# Patient Record
Sex: Male | Born: 1995 | Race: Black or African American | Hispanic: No | Marital: Single | Smoking: Current every day smoker
Health system: Southern US, Community
[De-identification: ages and names within clinical notes are randomized; demographics above are authoritative.]

---

## 2017-12-03 ENCOUNTER — Emergency Department (HOSPITAL_COMMUNITY)
Admission: EM | Admit: 2017-12-03 | Discharge: 2017-12-03 | Disposition: A | Discharge status: Home / Self Care | Payer: Self-pay | Attending: Emergency Medicine | Admitting: Emergency Medicine

## 2017-12-03 ENCOUNTER — Encounter (HOSPITAL_COMMUNITY): Payer: Self-pay | Admitting: *Deleted

## 2017-12-03 ENCOUNTER — Emergency Department (HOSPITAL_COMMUNITY): Payer: Self-pay

## 2017-12-03 ENCOUNTER — Other Ambulatory Visit: Payer: Self-pay

## 2017-12-03 DIAGNOSIS — F17200 Nicotine dependence, unspecified, uncomplicated: Secondary | ICD-10-CM | POA: Insufficient documentation

## 2017-12-03 DIAGNOSIS — J4541 Moderate persistent asthma with (acute) exacerbation: Secondary | ICD-10-CM | POA: Insufficient documentation

## 2017-12-03 DIAGNOSIS — J302 Other seasonal allergic rhinitis: Secondary | ICD-10-CM | POA: Insufficient documentation

## 2017-12-03 MED ORDER — PREDNISONE 10 MG PO TABS: ORAL_TABLET | ORAL | 0 refills | Status: AC

## 2017-12-03 MED ORDER — ALBUTEROL SULFATE HFA 108 (90 BASE) MCG/ACT IN AERS
2.0000 | INHALATION_SPRAY | RESPIRATORY_TRACT | Status: DC
  Administered 2017-12-03: 2 via RESPIRATORY_TRACT
  Filled 2017-12-03: qty 6.7

## 2017-12-03 MED ORDER — ALBUTEROL SULFATE (2.5 MG/3ML) 0.083% IN NEBU
5.0000 mg | INHALATION_SOLUTION | Freq: Once | RESPIRATORY_TRACT | Status: AC
  Administered 2017-12-03: 5 mg via RESPIRATORY_TRACT
  Filled 2017-12-03: qty 6

## 2017-12-03 MED ORDER — PREDNISONE 20 MG PO TABS
60.0000 mg | ORAL_TABLET | Freq: Once | ORAL | Status: AC
  Administered 2017-12-03: 60 mg via ORAL
  Filled 2017-12-03: qty 3

## 2017-12-03 NOTE — ED Provider Notes (Signed)
[facility]   CSN: [id number] Arrival date & time: 12/03/17  0607     History   Chief Complaint Chief Complaint  Patient presents with  . Shortness of Breath    HPI Herbert Jordan is a 22 y.o. male.  The history is provided by the patient. No language interpreter was used.  Shortness of Breath  This is a new problem. The problem occurs intermittently.The current episode started more than 2 days ago. The problem has been gradually worsening. Pertinent negatives include no fever. The problem's precipitants include pollens. He has tried nothing for the symptoms. The treatment provided no relief. Associated medical issues include asthma.    History reviewed. No pertinent past medical history.  There are no active problems to display for this patient.   History reviewed. No pertinent surgical history.      Home Medications    Prior to Admission medications   Medication Sig Start Date End Date Taking? Authorizing Provider  predniSONE (DELTASONE) 10 MG tablet 5,4,3,2,1 taper 12/03/17   Elson AreasSofia, Sholonda Jobst K, PA-C    Family History No family history on file.  Social History Social History   Tobacco Use  . Smoking status: Current Every Day Smoker  . Smokeless tobacco: Never Used  Substance Use Topics  . Alcohol use: Never    Frequency: Never  . Drug use: Not on file     Allergies   Patient has no known allergies.   Review of Systems Review of Systems  Constitutional: Negative for fever.  Respiratory: Positive for shortness of breath.   All other systems reviewed and are negative.    Physical Exam Updated Vital Signs BP 136/74 (BP Location: Right Arm)   Pulse 94   Temp 98.5 F (36.9 C) (Oral)   Resp 20   Ht 5\' 10"  (1.778 m)   Wt 96.6 kg (213 lb)   SpO2 100%   BMI 30.56 kg/m   Physical Exam  Constitutional: He appears well-developed and well-nourished.  HENT:  Head: Normocephalic and atraumatic.    Eyes: Conjunctivae are normal.  Neck: Neck supple.  Cardiovascular: Normal rate and regular rhythm.  No murmur heard. Pulmonary/Chest: Effort normal. No respiratory distress. He has wheezes in the right upper field, the right middle field, the right lower field, the left upper field, the left middle field and the left lower field.  Abdominal: Soft. There is no tenderness.  Musculoskeletal: He exhibits no edema.  Neurological: He is alert.  Skin: Skin is warm and dry.  Psychiatric: He has a normal mood and affect.  Nursing note and vitals reviewed.    ED Treatments / Results  Labs (all labs ordered are listed, but only abnormal results are displayed) Labs Reviewed - No data to display  EKG EKG Interpretation  Date/Time:  Thursday December 03 2017 06:37:03 EDT Ventricular Rate:  73 PR Interval:  146 QRS Duration: 100 QT Interval:  396 QTC Calculation: 436 R Axis:   75 Text Interpretation:  Normal sinus rhythm with sinus arrhythmia Normal ECG No old tracing to compare Confirmed by Shaune PollackIsaacs, Cameron [phone]([id number]) on 12/03/2017 7:27:12 AM   Radiology Dg Chest 2 View  Result Date: 12/03/2017 CLINICAL DATA:  Acute onset of generalized chest pain, shortness of breath and cough. EXAM: CHEST - 2 VIEW COMPARISON:  None. FINDINGS: The lungs are well-aerated and clear. There is no evidence of focal opacification, pleural effusion or pneumothorax. The heart is normal in size; the mediastinal contour is within  normal limits. No acute osseous abnormalities are seen. IMPRESSION: No acute cardiopulmonary process seen. Electronically Signed   By: Roanna Raider M.D.   On: 12/03/2017 07:19    Procedures Procedures (including critical care time)  Medications Ordered in ED Medications  albuterol (PROVENTIL HFA;VENTOLIN HFA) 108 (90 Base) MCG/ACT inhaler 2 puff (2 puffs Inhalation Given 12/03/17 0903)  albuterol (PROVENTIL) (2.5 MG/3ML) 0.083% nebulizer solution 5 mg (5 mg Nebulization Given 12/03/17 0838)   predniSONE (DELTASONE) tablet 60 mg (60 mg Oral Given 12/03/17 0903)     Initial Impression / Assessment and Plan / ED Course  I have reviewed the triage vital signs and the nursing notes.  Pertinent labs & imaging results that were available during my care of the patient were reviewed by me and considered in my medical decision making (see chart for details).    MDM  Pt feels better after albuterol,  Pt given prednisone and albuterol inhaler.   An After Visit Summary was printed and given to the patient.  Final Clinical Impressions(s) / ED Diagnoses   Final diagnoses:  Moderate persistent asthma with exacerbation  Seasonal allergies    ED Discharge Orders        Ordered    predniSONE (DELTASONE) 10 MG tablet     12/03/17 0851     Meds ordered this encounter  Medications  . albuterol (PROVENTIL) (2.5 MG/3ML) 0.083% nebulizer solution 5 mg  . predniSONE (DELTASONE) tablet 60 mg  . albuterol (PROVENTIL HFA;VENTOLIN HFA) 108 (90 Base) MCG/ACT inhaler 2 puff  . predniSONE (DELTASONE) 10 MG tablet    Sig: 5,4,3,2,1 taper    Dispense:  15 tablet    Refill:  0    Order Specific Question:   Supervising Provider    Answer:   Eber Hong [[id number]]      Elson Areas, PA-C 12/03/17 4540    Shaune Pollack, MD 12/03/17 [phone]

## 2017-12-03 NOTE — Discharge Instructions (Signed)
Return if any problems.

## 2017-12-03 NOTE — ED Triage Notes (Signed)
The pt is c/o some difficulty breathing since yesterday  He has been taking benadryl and that has not helped  He stopped smoking one month ago  No respiratory distress at present  He has seasonal allergies

## 2019-05-02 IMAGING — CR DG CHEST 2V
2 series · 2 of 2 positions shown · non-contrast
Comparison: None.

CLINICAL DATA: Acute onset of generalized chest pain, shortness of
breath and cough.

EXAM:
CHEST - 2 VIEW

[chest pa]
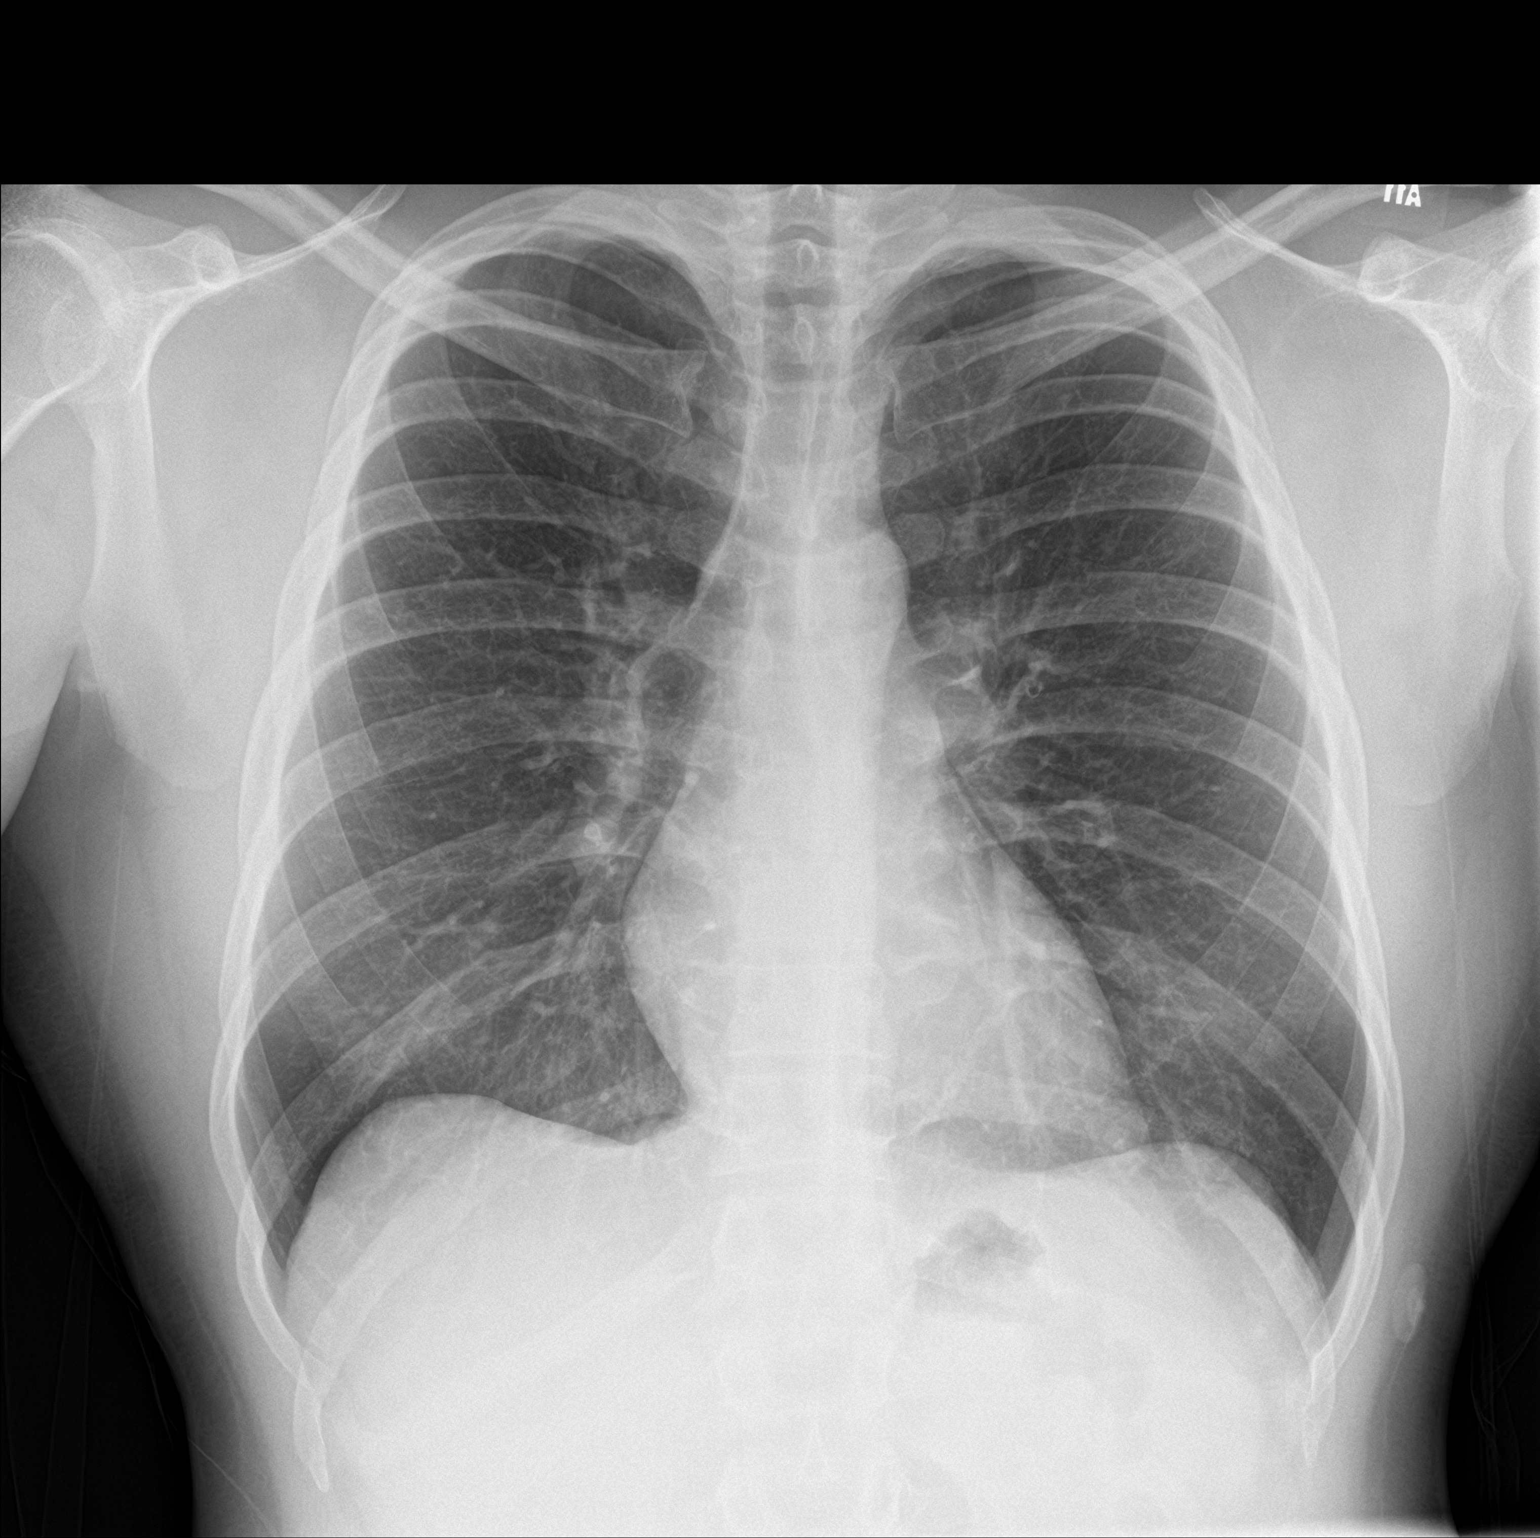

[chest lat]
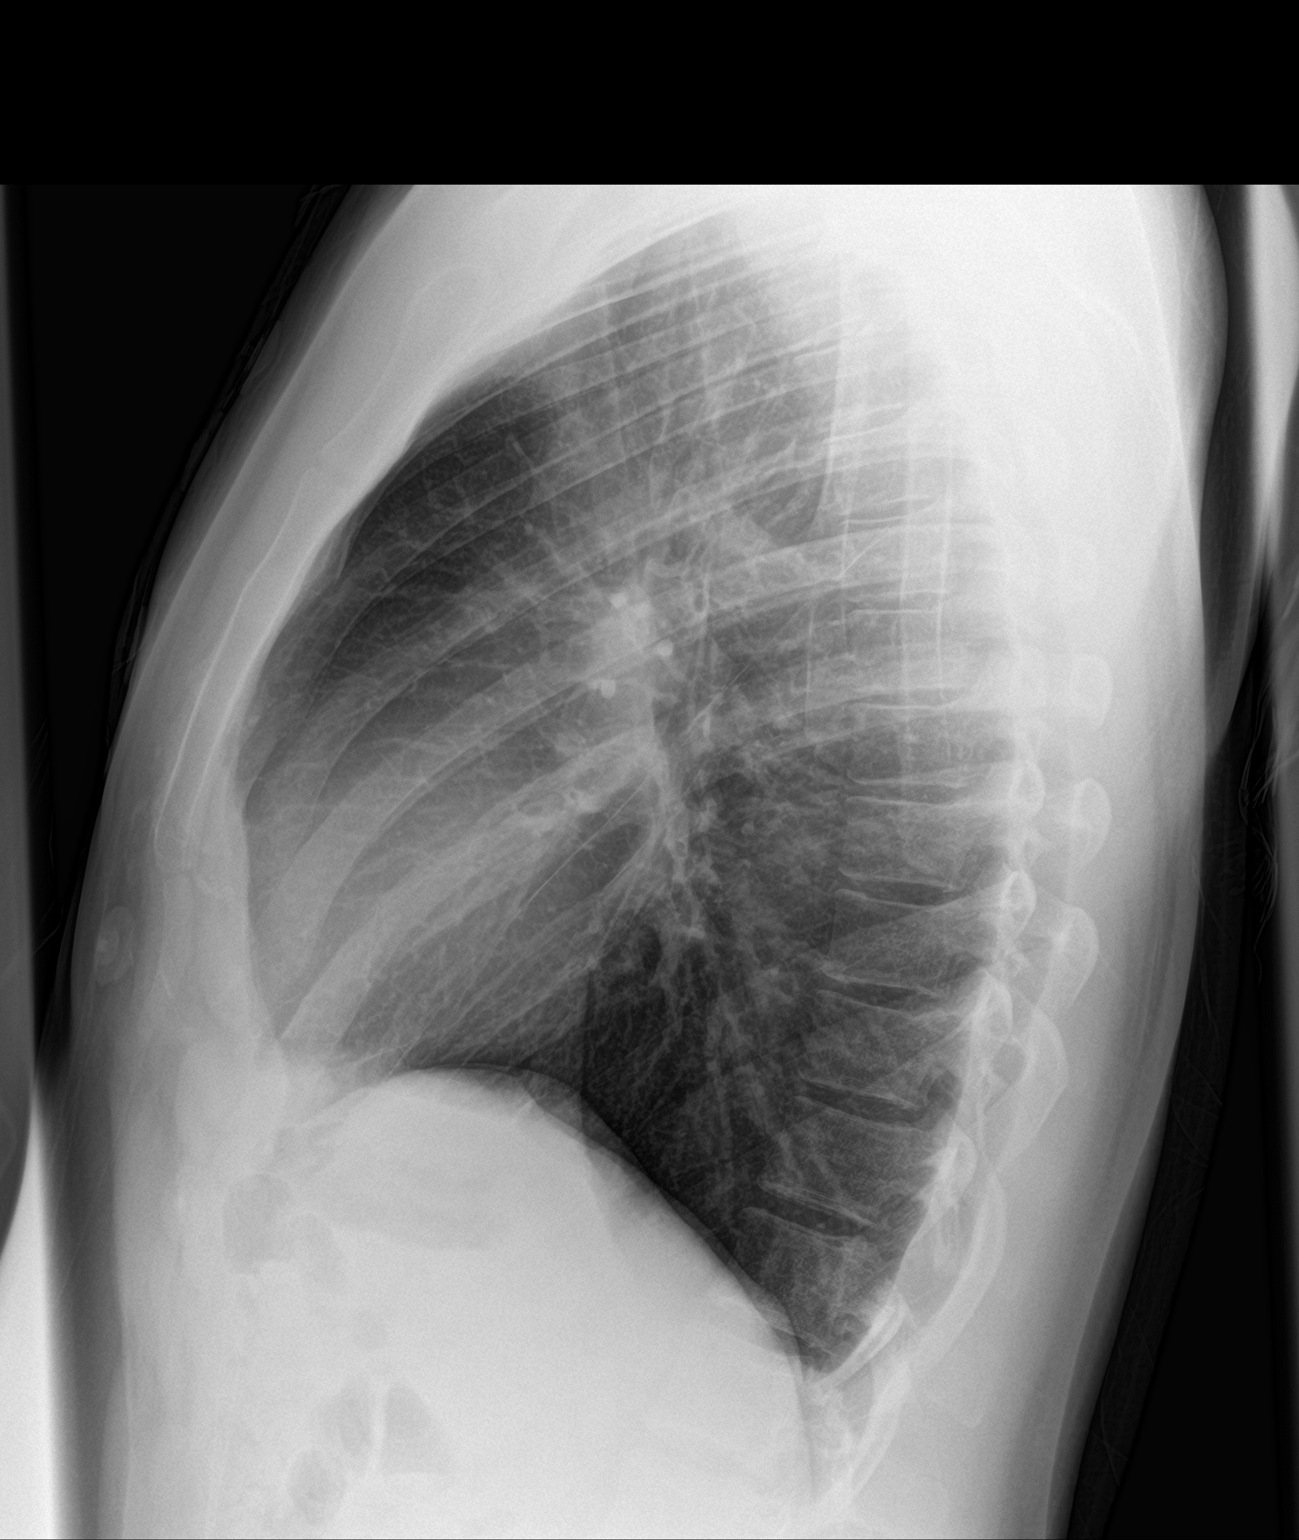

[2 of 2 positions shown; findings below may reference images not displayed]

FINDINGS: The lungs are well-aerated and clear. There is no evidence of focal
opacification, pleural effusion or pneumothorax.

The heart is normal in size; the mediastinal contour is within
normal limits. No acute osseous abnormalities are seen.
IMPRESSION: No acute cardiopulmonary process seen.

## 2019-10-21 ENCOUNTER — Ambulatory Visit (HOSPITAL_COMMUNITY)
Admission: EM | Admit: 2019-10-21 | Discharge: 2019-10-21 | Disposition: A | Discharge status: Home / Self Care | Payer: BC Managed Care – PPO | Attending: Family Medicine | Admitting: Family Medicine

## 2019-10-21 ENCOUNTER — Encounter (HOSPITAL_COMMUNITY): Payer: Self-pay

## 2019-10-21 ENCOUNTER — Other Ambulatory Visit: Payer: Self-pay

## 2019-10-21 DIAGNOSIS — Z113 Encounter for screening for infections with a predominantly sexual mode of transmission: Secondary | ICD-10-CM | POA: Insufficient documentation

## 2019-10-21 NOTE — ED Triage Notes (Signed)
Pt presents for STD testing with no known symptoms.  

## 2019-10-21 NOTE — Discharge Instructions (Addendum)
Your STD tests are pending.  If your test results are positive, we will call you.  You may need additional treatment and your partner(s) may also need treatment.      

## 2019-10-21 NOTE — ED Provider Notes (Signed)
[facility]    CSN: [id number] Arrival date & time: 10/21/19  1339      History   Chief Complaint Chief Complaint  Patient presents with  . STD Testing    HPI Herbert Jordan is a 24 y.o. male.   Reports that he would like to be swabbed for STDs today.  Denies any penile discharge, dysuria, pain with sex, any other symptoms.  Denies that any of his recent partners have told him that he needs to be tested.  ROS per HPI  The history is provided by the patient.    History reviewed. No pertinent past medical history.  There are no problems to display for this patient.   History reviewed. No pertinent surgical history.     Home Medications    Prior to Admission medications   Medication Sig Start Date End Date Taking? Authorizing Provider  predniSONE (DELTASONE) 10 MG tablet 5,4,3,2,1 taper 12/03/17   Elson Areas, PA-C    Family History Family History  Family history unknown: Yes    Social History Social History   Tobacco Use  . Smoking status: Current Every Day Smoker  . Smokeless tobacco: Never Used  Substance Use Topics  . Alcohol use: Never  . Drug use: Not on file     Allergies   Patient has no known allergies.   Review of Systems Review of Systems   Physical Exam Triage Vital Signs ED Triage Vitals  Enc Vitals Group     BP      Pulse      Resp      Temp      Temp src      SpO2      Weight      Height      Head Circumference      Peak Flow      Pain Score      Pain Loc      Pain Edu?      Excl. in GC?    No data found.  Updated Vital Signs BP 125/77 (BP Location: Left Arm)   Pulse 75   Temp 98.5 F (36.9 C) (Oral)   Resp 18   SpO2 100%   Visual Acuity Right Eye Distance:   Left Eye Distance:   Bilateral Distance:    Right Eye Near:   Left Eye Near:    Bilateral Near:     Physical Exam Vitals and nursing note reviewed.  Constitutional:      General: He is not in acute distress.    Appearance:  Normal appearance. He is well-developed and normal weight.  HENT:     Head: Normocephalic and atraumatic.  Eyes:     Conjunctiva/sclera: Conjunctivae normal.  Cardiovascular:     Rate and Rhythm: Normal rate and regular rhythm.     Heart sounds: Normal heart sounds. No murmur.  Pulmonary:     Effort: Pulmonary effort is normal. No respiratory distress.     Breath sounds: Normal breath sounds.  Abdominal:     Palpations: Abdomen is soft.     Tenderness: There is no abdominal tenderness.  Musculoskeletal:     Cervical back: Neck supple.  Skin:    General: Skin is warm and dry.  Neurological:     General: No focal deficit present.     Mental Status: He is alert and oriented to person, place, and time.  Psychiatric:        Mood and Affect: Mood normal.  Behavior: Behavior normal.      UC Treatments / Results  Labs (all labs ordered are listed, but only abnormal results are displayed) Labs Reviewed  CYTOLOGY, (ORAL, ANAL, URETHRAL) ANCILLARY ONLY    EKG   Radiology No results found.  Procedures Procedures (including critical care time)  Medications Ordered in UC Medications - No data to display  Initial Impression / Assessment and Plan / UC Course  I have reviewed the triage vital signs and the nursing notes.  Pertinent labs & imaging results that were available during my care of the patient were reviewed by me and considered in my medical decision making (see chart for details).     Presents for STD screen today.  Instructed to abstain from sex until results are back.  Instructed on safe sex.  Will inform patient of results and treat accordingly.  Follow-up with primary care with this office for any other concerns. Final Clinical Impressions(s) / UC Diagnoses   Final diagnoses:  Screen for STD (sexually transmitted disease)     Discharge Instructions     Your STD tests are pending.  If your test results are positive, we will call you.  You may need  additional treatment and your partner(s) may also need treatment.         ED Prescriptions    None     PDMP not reviewed this encounter.   Faustino Congress, NP 10/21/19 1414

## 2019-10-26 LAB — CYTOLOGY, (ORAL, ANAL, URETHRAL) ANCILLARY ONLY
Chlamydia: NEGATIVE
Neisseria Gonorrhea: NEGATIVE
Trichomonas: NEGATIVE
